# Patient Record
Sex: Female | Born: 1975 | Race: White | Hispanic: No | Marital: Married
Health system: Southern US, Community
[De-identification: ages and names within clinical notes are randomized; demographics above are authoritative.]

## PROBLEM LIST (undated history)

## (undated) HISTORY — PX: BREAST BIOPSY: SHX20

---

## 2001-08-21 ENCOUNTER — Inpatient Hospital Stay (HOSPITAL_COMMUNITY): Admission: AD | Admit: 2001-08-21 | Discharge: 2001-08-24 | Payer: Self-pay | Admitting: Obstetrics and Gynecology

## 2001-08-24 ENCOUNTER — Inpatient Hospital Stay (HOSPITAL_COMMUNITY): Admission: AD | Admit: 2001-08-24 | Discharge: 2001-08-24 | Payer: Self-pay | Admitting: Obstetrics & Gynecology

## 2001-09-17 ENCOUNTER — Other Ambulatory Visit: Admission: RE | Admit: 2001-09-17 | Discharge: 2001-09-17 | Payer: Self-pay | Admitting: Obstetrics and Gynecology

## 2002-10-08 ENCOUNTER — Other Ambulatory Visit: Admission: RE | Admit: 2002-10-08 | Discharge: 2002-10-08 | Payer: Self-pay | Admitting: Obstetrics and Gynecology

## 2004-11-24 ENCOUNTER — Inpatient Hospital Stay (HOSPITAL_COMMUNITY): Admission: RE | Admit: 2004-11-24 | Discharge: 2004-11-27 | Payer: Self-pay | Admitting: Obstetrics and Gynecology

## 2008-06-28 ENCOUNTER — Encounter: Admission: RE | Admit: 2008-06-28 | Discharge: 2008-06-28 | Payer: Self-pay | Admitting: Endocrinology

## 2008-06-30 ENCOUNTER — Encounter: Admission: RE | Admit: 2008-06-30 | Discharge: 2008-06-30 | Payer: Self-pay | Admitting: Endocrinology

## 2009-10-03 ENCOUNTER — Encounter: Admission: RE | Admit: 2009-10-03 | Discharge: 2009-10-03 | Payer: Self-pay | Admitting: Endocrinology

## 2010-09-12 IMAGING — US US SOFT TISSUE HEAD/NECK
1 series · 14 of 25 positions shown · non-contrast
Comparison: None.

June 30, 2008 –DUPLICATE COPY for exam association in [facility]. No change from original report.
CLINICAL DATA: Goiter by physical exam.

 THYROID ULTRASOUND
TECHNIQUE: Ultrasound examination of the thyroid gland and
 adjacent soft tissues was performed.

[Series 1: us soft tissue head/neck · 0.08mm/px · 14 of 38 slices shown]
[im 1/38]
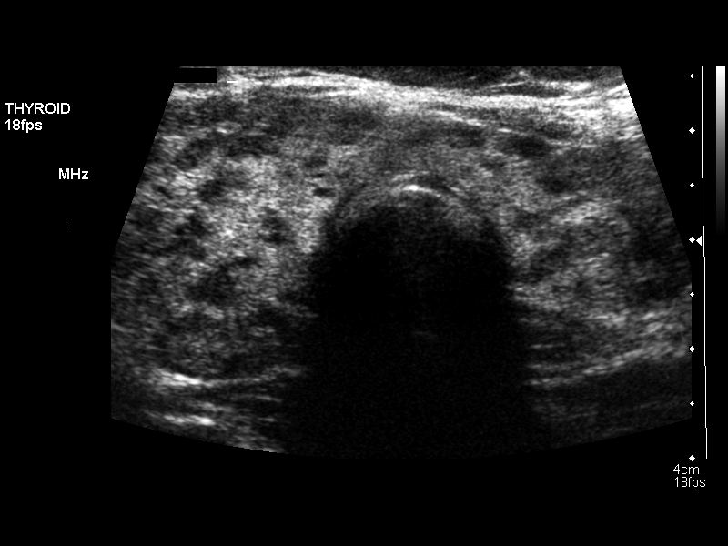
[im 4/38]
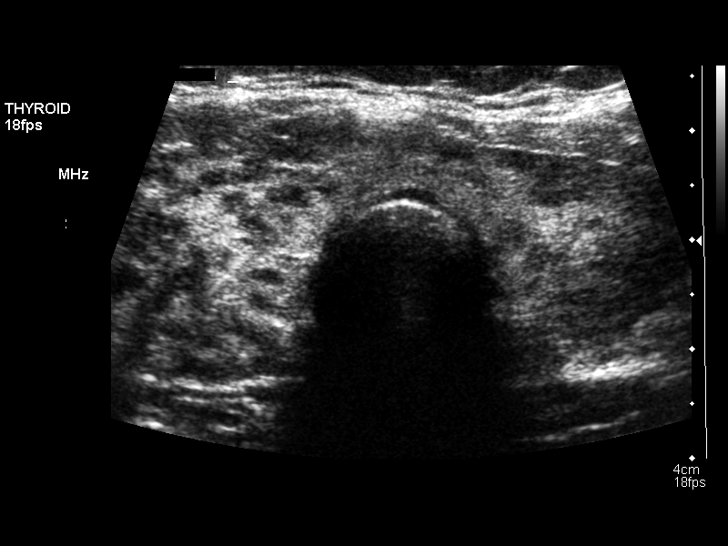
[im 7/38]
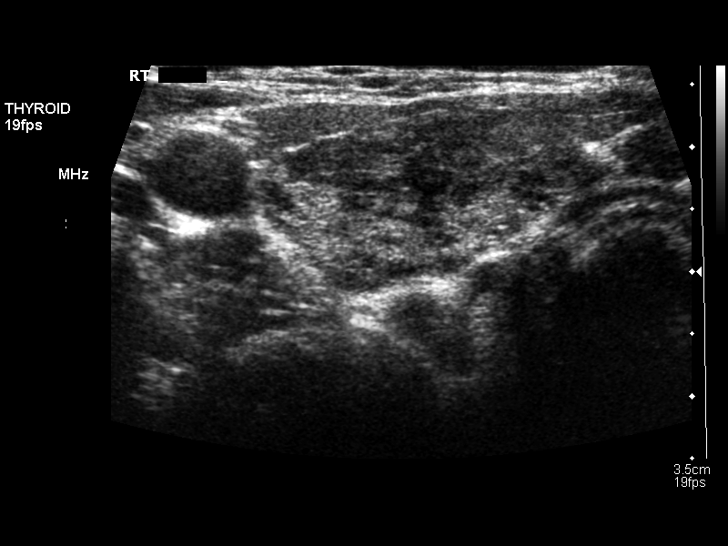
[im 10/38]
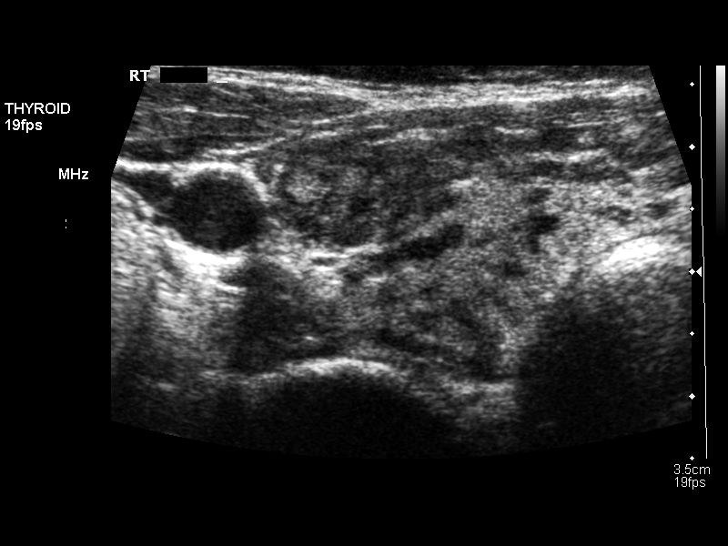
[im 13/38]
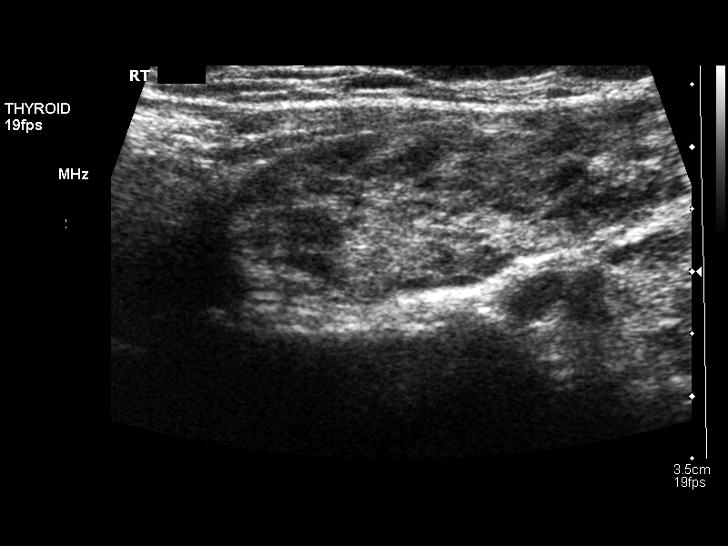
[im 14/38]
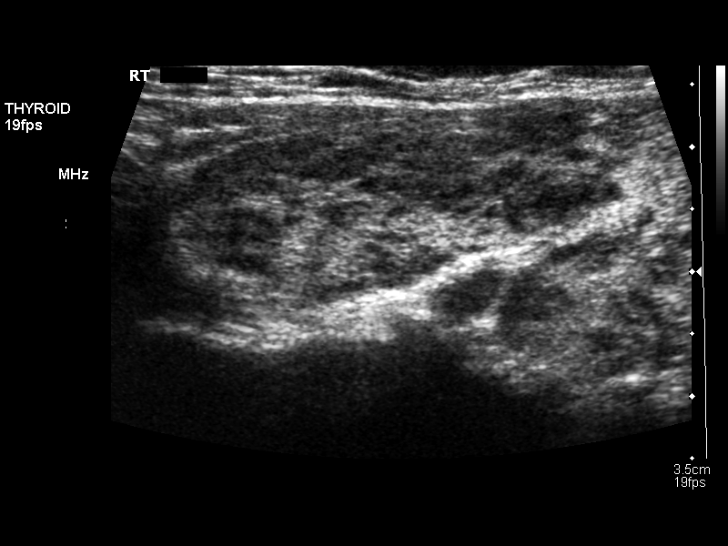
[im 17/38]
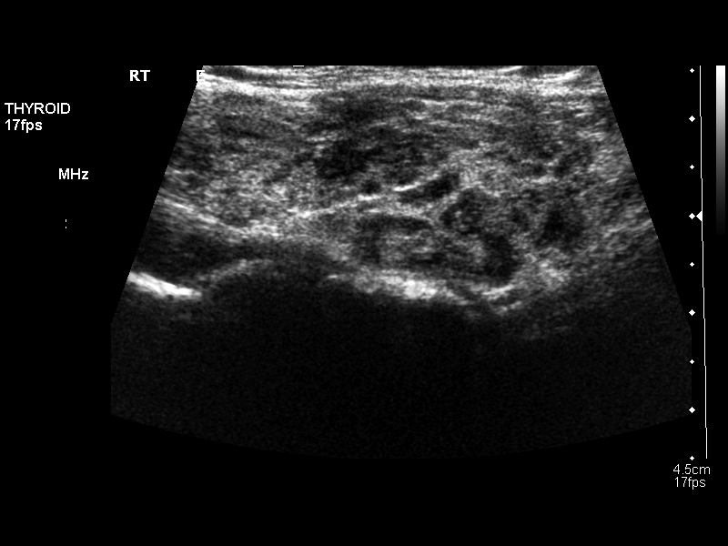
[im 21/38]
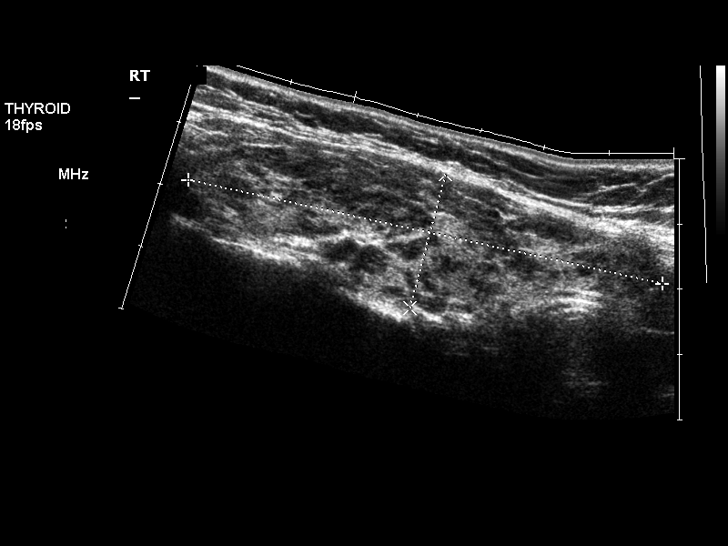
[im 24/38]
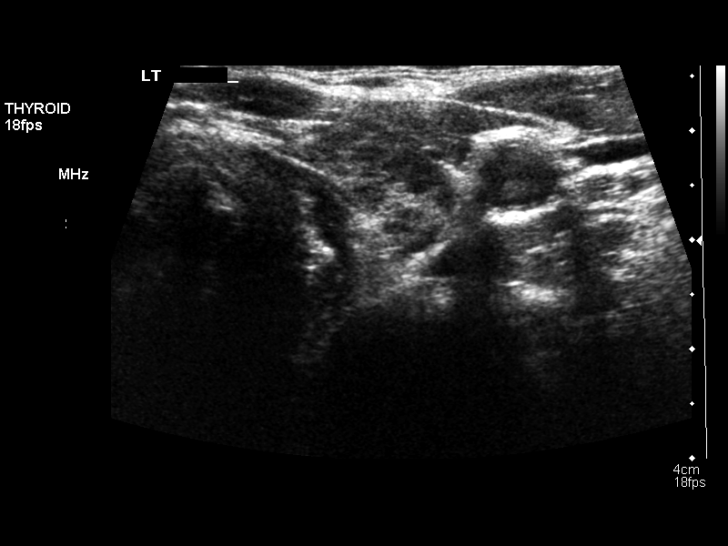
[im 25/38]
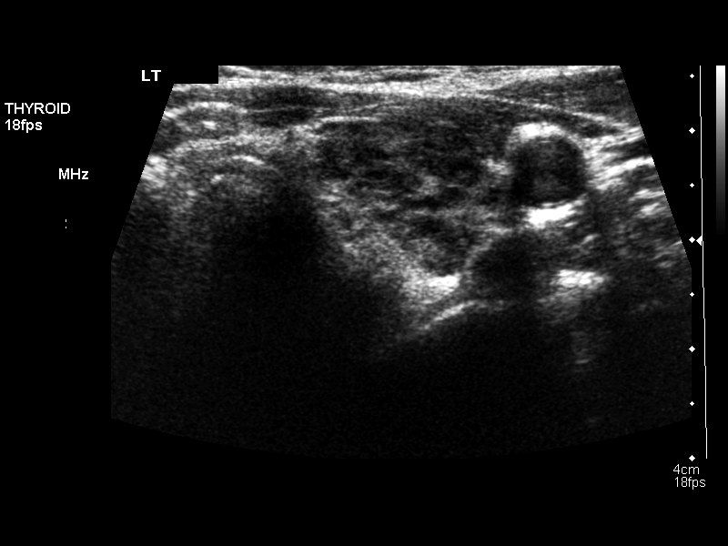
[im 28/38]
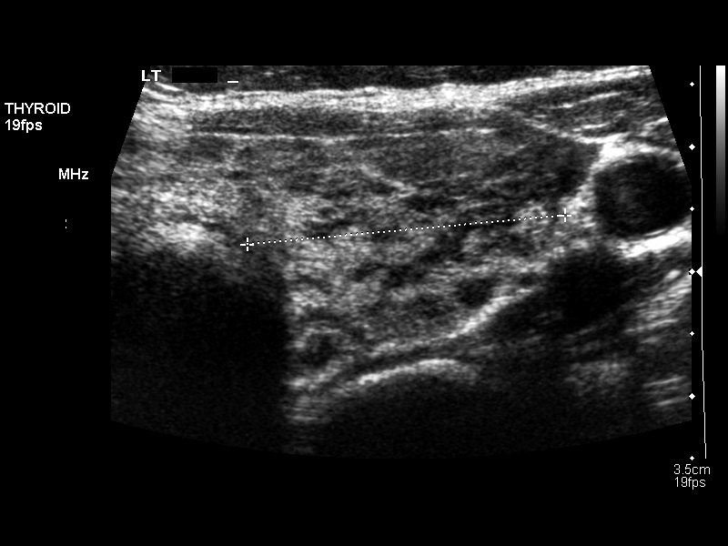
[im 31/38]
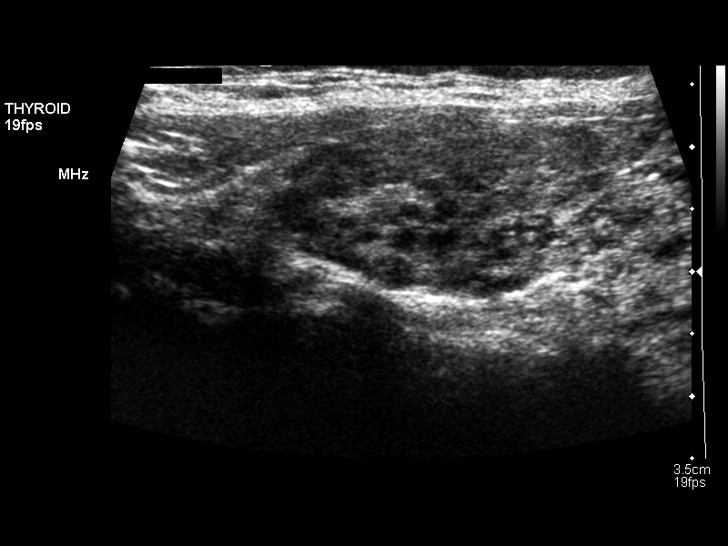
[im 34/38]
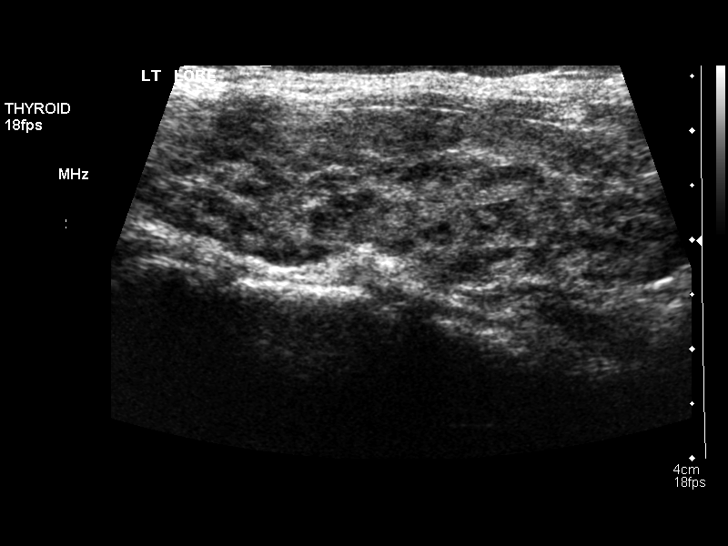
[im 38/38]
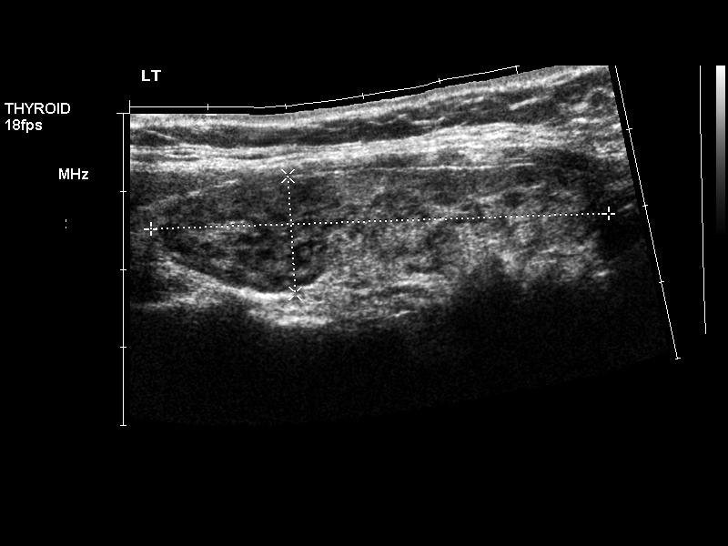

[14 of 25 positions shown; findings below may reference images not displayed]

FINDINGS: Heterogeneously enlarged thyroid gland. Right lobe
 measures 7.4 x 2.1 x 2.5 cm. Left lobe measures 6.0 x 1.5 x
 cm. Isthmus measures 0.93 cm. Initially, questionable lesions
 within the mid aspect of the right lobe of the thyroid gland. The
 patient returned for additional imaging and no dominant masses
 noted. Stability of this appearance can be confirmed on follow-
 up.
IMPRESSION: Thyroid goiter with heterogeneous appearance. No
 dominant solid masses identified.

## 2010-09-15 NOTE — H&P (Signed)
[facility]  Patient:    Gina Graham, Gina Graham Visit Number: [id number] MRN: [id number]          Service Type: OBS Location: 910B 9198 03 Attending Physician:  Lenoard Aden Dictated by:   Lenoard Aden, M.D. Admit Date:  08/21/2001                           History and Physical  CHIEF COMPLAINT:              Macrosomia.  HISTORY OF PRESENT ILLNESS:   The patient is a 35 year old Caucasian female, G1, P0, EDD Aug 28, 2001 of 39 weeks with ultrasound estimated fetal weight of greater than 4500 gm with polyhydramnios for elective primary cesarean section.  PAST MEDICAL HISTORY:         Remarkable for questionable hypothyroidism with normal TSH this pregnancy. History of migraine headaches, history of cholecystectomy in February 2002. Family history of cervical cancer. No other medical or surgical hospitalizations.  Note, pregnancy course complicated only by polyhydramnios with macrosomia and reassuring fetal surveillance.  ALLERGIES:                    PENICILLIN.  PRENATAL LABORATORY DATA:     Blood type A-, Rh antibody negative, rubella nonimmune, hepatitis B surface antigen negative, HIV nonreactive. GC, chlamydia negative.  PHYSICAL EXAMINATION:  GENERAL:                      She is a well-developed, well-nourished, white female in no apparent distress.  HEENT:                        Normal.  LUNGS:                        Clear.  HEART:                        Regular rhythm.  ABDOMEN:                      Soft, gravid, nontender. Estimated fetal weight 10 to 10 1/2 pounds. Cervix is closed. ______ vertex and floating.  EXTREMITIES:                  Reveal no ______.  NEUROLOGIC:                   Nonfocal.  IMPRESSION:                    1. A 39 week intrauterine pregnancy.                                2. Rubella not immune.                                3. Rh negative.                                4. Presumed  macrosomia.  PLAN:                         Proceed with  primary cesarean section for macrosomia. Risks of inaccuracy of estimated fetal weight on ultrasound discussed 10-15% discrepancy with estimated fetal weight potentially ranging between 8 1/2 to 9 pounds to as large as 11 pounds discussed. Risk of shoulder distortion, fourth degree laceration and traumatic vaginal delivery discussed as well. After the patients questions are answered, she has made the elective decision in conjunction with her husband despite inaccuracies of estimated fetal weight to proceed with primary cesarean section. The risks of anesthesia, infection, bleeding, injury to abdominal organs and need for repair is discussed. Delayed versus immediate complications to include bowel and bladder injury noted. The patient desires to proceed. Dictated by:   Lenoard Aden, M.D. Attending Physician:  Lenoard Aden DD:  08/21/01 TD:  08/21/01 Job: [id number]

## 2010-09-15 NOTE — H&P (Signed)
Gina Graham, Gina Graham              ACCOUNT NO.:  [id number]   MEDICAL RECORD NO.:  [id number]          PATIENT TYPE:  INP   LOCATION:  NA                            FACILITY:  [facility]   PHYSICIAN:  Lenoard Aden, M.D.DATE OF BIRTH:  1975-08-20   DATE OF ADMISSION:  11/24/2004  DATE OF DISCHARGE:                                HISTORY & PHYSICAL   CHIEF COMPLAINT:  Severe polyhydramnios at 38 weeks for elective repeat  cesarean section with presumed macrosomia.   HISTORY OF PRESENT ILLNESS:  The patient is a 35 year old white female,  gravida 2, para 1, EDD of December 13, 2004, with progressively worsening  polyhydramnios and an estimated fetal weight of greater than 4500 grams who  presents for induction.   PAST MEDICAL HISTORY:  Remarkable for questionable hypothyroidism with  normal TSH, history of previous cesarean section, history of migraine  headaches, history of cholecystectomy.   FAMILY HISTORY:  Remarkable for cervical cancer.   ALLERGIES:  PENICILLIN.   MEDICATIONS:  Prenatal vitamins.   PRENATAL LABORATORY DATA:  Blood type A negative, Rh antibody negative,  rubella immune, hepatitis B surface antigen negative, HIV nonreactive.  GC  and Chlamydia negative.   PHYSICAL EXAMINATION:  GENERAL:  She is a well-developed, well-nourished,  white female in no acute distress.  HEENT:  Normal.  LUNGS:  Clear.  HEART:  Regular rate and rhythm.  ABDOMEN:  Soft, gravid, and nontender.  Fundal height of 46.  PELVIC:  Cervix is closed, long, vertex -2.   IMPRESSION:  1.  38-week intrauterine pregnancy.  2.  Severe worsening polyhydramnios with presumed macrosomia.   PLAN:  Proceed with elective repeat cesarean section.  Risks and benefits  discussed.  Small risk of prematurity noted.  The patient acknowledges and  wishes to proceed.  Risks of anesthesia, infection, bleeding, injury to  abdominal organs with need for repair noted.       RJT/MEDQ  D:  11/23/2004  T:   11/23/2004  Job:  [id number]

## 2010-09-15 NOTE — Discharge Summary (Signed)
Gina Graham, Gina Graham              ACCOUNT NO.:  [id number]   MEDICAL RECORD NO.:  [id number]          PATIENT TYPE:  INP   LOCATION:  9108                          FACILITY:  [facility]   PHYSICIAN:  Lenoard Aden, M.D.DATE OF BIRTH:  29-Jun-1975   DATE OF ADMISSION:  11/24/2004  DATE OF DISCHARGE:  11/27/2004                                 DISCHARGE SUMMARY   Patient underwent uncomplicated repeat cesarean section on November 24, 2004.  Postoperative course uncomplicated.  Hemoglobin 10.2.  Pain control  adequate.  Discharged to home postoperative day #3.  Tolerated regular diet  well.  Discharge teaching done.  Discharge medications to include Tylox,  prenatal vitamins.  Follow up in the office in four to six weeks.      Lenoard Aden, M.D.  Electronically Signed     RJT/MEDQ  D:  01/14/2005  T:  01/15/2005  Job:  [id number]

## 2010-09-15 NOTE — Discharge Summary (Signed)
[facility]  Patient:    Gina Graham, Gina Graham Visit Number: [id number] MRN: [id number]          Service Type: MED Location: [facility] Attending Physician:  Genia Del Dictated by:   Lenoard Aden, M.D. Admit Date:  08/24/2001 Discharge Date: 08/24/2001                             Discharge Summary  ADMISSION DIAGNOSES:          Presumed macrosomia at 39 weeks.  DISCHARGE DIAGNOSES:          Presumed macrosomia at 39 weeks.  HOSPITAL COURSE:              Patient underwent uncomplicated primary cesarean section August 20, 2001 without complications.  Postpartum course uncomplicated.  Tolerated regular diet without difficulty.  Hemoglobin 10.9, hematocrit 31.5.  Discharged to home day #3.  Tylox given.  Discharge teaching done.  Follow up in the office in four to six weeks. Dictated by:   Lenoard Aden, M.D. Attending Physician:  Genia Del DD:  09/14/01 TD:  09/16/01 Job: [id number]

## 2010-09-15 NOTE — Discharge Summary (Signed)
Gina Graham, LACROSS              ACCOUNT NO.:  [id number]   MEDICAL RECORD NO.:  [id number]          PATIENT TYPE:  INP   LOCATION:  9108                          [facility]ILITY:  [facility]   PHYSICIAN:  Lenoard Aden, M.D.DATE OF BIRTH:  10/03/1975   DATE OF ADMISSION:  11/24/2004  DATE OF DISCHARGE:  11/27/2004                                 DISCHARGE SUMMARY   The patient underwent a complicated repeat cesarean section on November 25, 2004.  Postoperative course uncomplicated.  Discharged to home day #3.  Tylox, prenatal vitamins and iron given.  Discharge teaching done.   DISCHARGE DIAGNOSIS:  Repeat cesarean section.   FOLLOW UP:  Follow up in the office in four to six weeks.      Lenoard Aden, M.D.  Electronically Signed     RJT/MEDQ  D:  01/22/2005  T:  01/23/2005  Job:  [id number]

## 2010-09-15 NOTE — Op Note (Signed)
[facility]  Patient:    Gina Graham, Gina Graham Visit Number: [id number] MRN: [id number]          Service Type: OBS Location: 910A 9113 01 Attending Physician:  Lenoard Aden Dictated by:   Lenoard Aden, M.D. Proc. Date: 08/21/01 Admit Date:  08/21/2001                             Operative Report  PREOPERATIVE DIAGNOSES:       A 39 week intrauterine pregnancy, presumed macrosomia.  POSTOPERATIVE DIAGNOSES:      A 39 week intrauterine pregnancy, presumed macrosomia.  PROCEDURE:                    Primary low transverse cesarean section.  SURGEON:                      Lenoard Aden, M.D.  ASSISTANTBasilia Jumbo  ANESTHESIA:                   Spinal by [place].  ESTIMATED BLOOD LOSS:         1000 cc.  DRAINS:                       Foley.  COUNTS:                       Correct.  DISPOSITION:                  Patient to recovery in good condition.  FINDINGS:                     Full-term living female occiput anterior position. Placenta manually and intact.  Three vessel cord.  An 8 pound 15 ounce female. Pediatricians in attendance.  Apgars 8 and 9.  Normal tubes.  Normal ovaries.  BRIEF OPERATIVE NOTE:         After achieving adequate spinal anesthesia patient is prepped and draped in the usual sterile fashion.  Foley catheter placed.  After achieving adequate anesthesia dilute Marcaine solution placed in the area.  Pfannenstiel skin incision was then made with the scalpel, carried down to the fascia which was nicked in the midline and opened transversely using Mayo scissors.  Rectus muscles dissected sharply in the midline.  Peritoneum entered sharply.  Visceroperitoneum scored in a smile like fashion after bladder blade is placed.  The scoring of the lower uterine segment with a Kerr hysterotomy incision was performed.  Clear amniotic fluid. Atraumatic delivery from vertex position of a full-term living female.   Handed to pediatricians in attendance.  Apgars 8 and 9.  Placenta delivered manually intact.  Cord blood collected.  Uterus curetted using a dry lap pack. Inspection reveals normal tubes and ovaries.  Uterine incision closed in two layers using a 0 Monocryl suture.  Bladder flap inspected and found to be hemostatic.  Pericolic gutters irrigated.  All blood clots subsequently removed.  Fascia then closed using a 0 Vicryl in a continuous running fashion. Skin closed using staples.  Patient tolerates procedure well.  Is transferred to recovery in good condition. Dictated by:   Lenoard Aden, M.D. Attending Physician:  Lenoard Aden DD:  08/21/01 TD:  08/21/01 Job:  [id number]

## 2010-09-15 NOTE — Op Note (Signed)
NAMEJANELIZ, Gina Graham              ACCOUNT NO.:  [id number]   MEDICAL RECORD NO.:  [id number]          PATIENT TYPE:  INP   LOCATION:  9199                          FACILITY:  [facility]   PHYSICIAN:  Lenoard Aden, M.D.DATE OF BIRTH:  1975/07/19   DATE OF PROCEDURE:  11/24/2004  DATE OF DISCHARGE:                                 OPERATIVE REPORT   PREOPERATIVE DIAGNOSIS:  Severe polyhydramnios at 38 weeks.  Previous  cesarean section.   POSTOPERATIVE DIAGNOSIS:  Severe polyhydramnios at 38 weeks.  Previous  cesarean section.  Plus breech presentation.   PROCEDURE:  Repeat low transverse cesarean section.   SURGEON:  Lenoard Aden, M.D.   ASSISTANT:  Pershing Cox, M.D.   ANESTHESIA:  Spinal by Octaviano Glow. [name], M.D.   ESTIMATED BLOOD LOSS:  1000 mL.   COMPLICATIONS:  None.   DRAINS:  None.   FINDINGS:  Full term living female in frank breech presentation.  Apgars 8  and 9.  Cord blood collected.  Placenta delivered from posterior location  intact.  The patient to recovery room in good condition.   DESCRIPTION OF PROCEDURE:  After being apprised of the risks of anesthesia,  infection, bleeding, injury to abdominal organs with need for repair,  delayed versus immediate complications to include bowel and bladder injury,  the patient was brought to the operating room where she was administered a  spinal anesthetic without complications.  Foley catheter placed.  After  achieving adequate anesthesia, dilute Marcaine solution placed.  A  Pfannenstiel skin incision was made with a scalpel and carried down to the  fascia which was nicked in the midline and opened transversely using Mayo  scissors.  Rectus muscles dissected sharply in the midline.  Peritoneum  entered sharply.  Bladder blade placed.  Visceroperitoneum was scored in a  smile-like fashion, dissected sharply off the lower uterine segment.  Kerr  hysterotomy incision made.  Copious amounts of clear amniotic  fluid seen.  Frank breech presentation encountered and delivered using usual maneuvers  atraumatically in standard fashion.  Bulb suction performed.  Cord clamped  and cut.  Bulb suctioning performed.  Placenta delivered manually intact.  Three-vessel cord noted.  Uterus exteriorized and curetted using a dry  laparoscopy pack.  Closed in two layers using 0 Monocryl suture.  Bladder  flap inspected and found to be hemostatic.  Three interrupted sutures placed  along bleeding points  along the incision without difficulty.  Good hemostasis noted.  Irrigation  accomplished.  Fascia then closed using a 0 Monocryl in continuous running  fashion.  The subcutaneous tissue closed using a 3-0 plain suture.  Skin  used using staples.  The patient tolerated the procedure well and is  transferred to recovery room in good condition.       RJT/MEDQ  D:  11/24/2004  T:  11/24/2004  Job:  [id number]

## 2011-06-22 ENCOUNTER — Other Ambulatory Visit: Payer: Self-pay | Admitting: Endocrinology

## 2011-06-22 DIAGNOSIS — E049 Nontoxic goiter, unspecified: Secondary | ICD-10-CM

## 2011-06-26 ENCOUNTER — Ambulatory Visit
Admission: RE | Admit: 2011-06-26 | Discharge: 2011-06-26 | Disposition: A | Discharge status: Home / Self Care | Payer: 59 | Source: Ambulatory Visit | Attending: Endocrinology | Admitting: Endocrinology

## 2011-06-26 DIAGNOSIS — E049 Nontoxic goiter, unspecified: Secondary | ICD-10-CM

## 2011-12-16 IMAGING — US US SOFT TISSUE HEAD/NECK
1 series · 14 of 25 positions shown · non-contrast
Comparison: Thyroid ultrasound 06/30/2008.

CLINICAL DATA: Follow up goiter. Patient on Levothyroxine.  History
of Hashimoto's disease.

THYROID ULTRASOUND 10/03/2009:
TECHNIQUE: Ultrasound examination of the thyroid gland and
adjacent soft tissues was performed.

[Series 1: us soft tissue head/neck · 0.08mm/px · 14 of 25 slices shown]
[im 1/25]
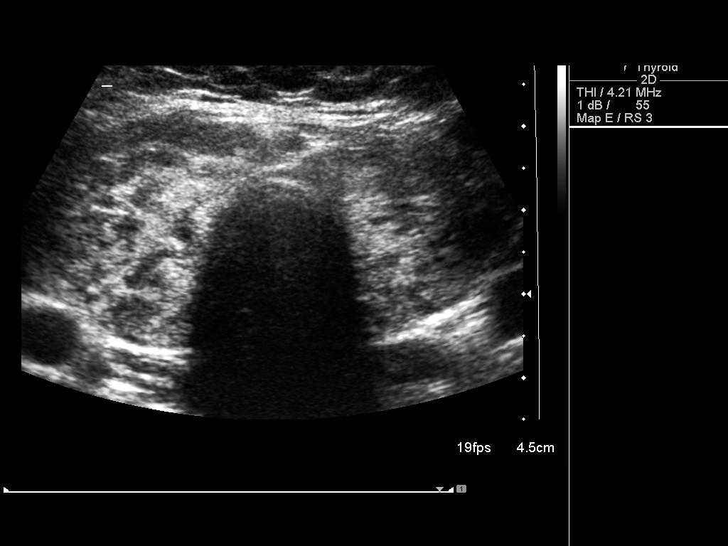
[im 3/25]
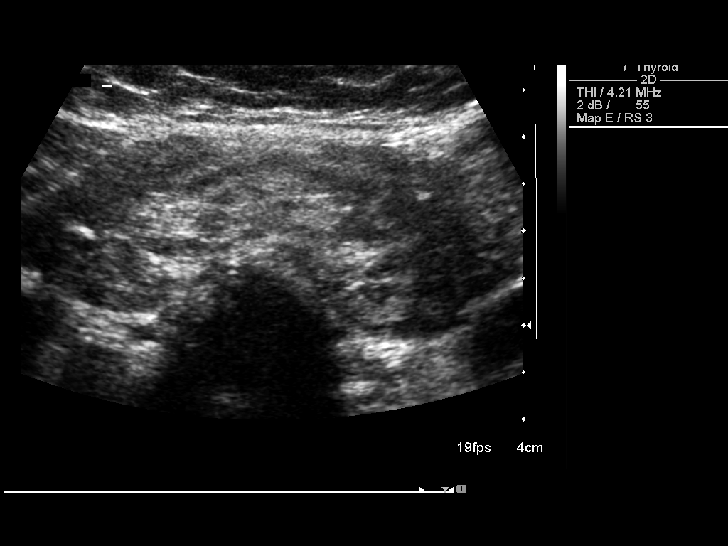
[im 5/25]
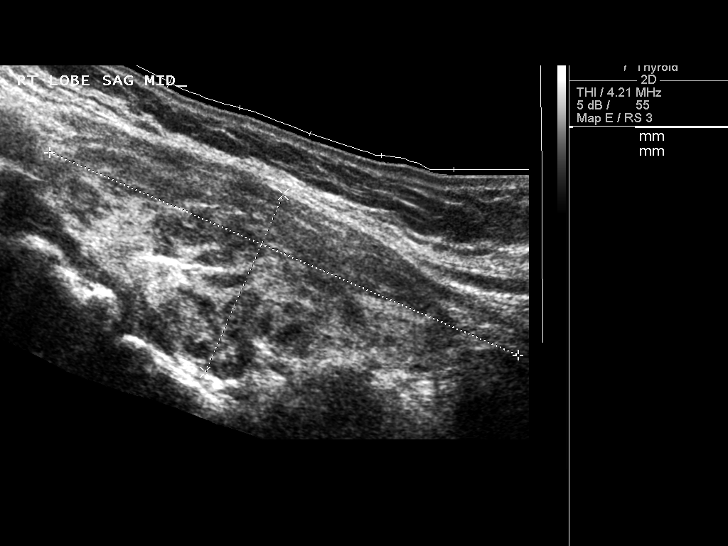
[im 7/25]
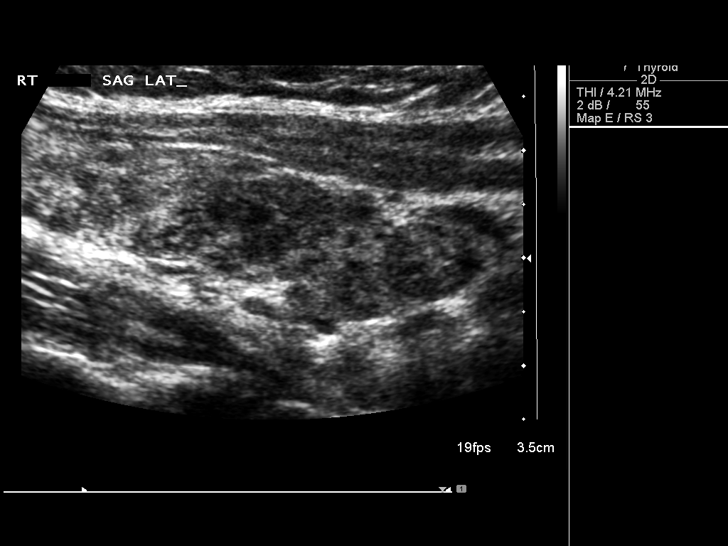
[im 9/25]
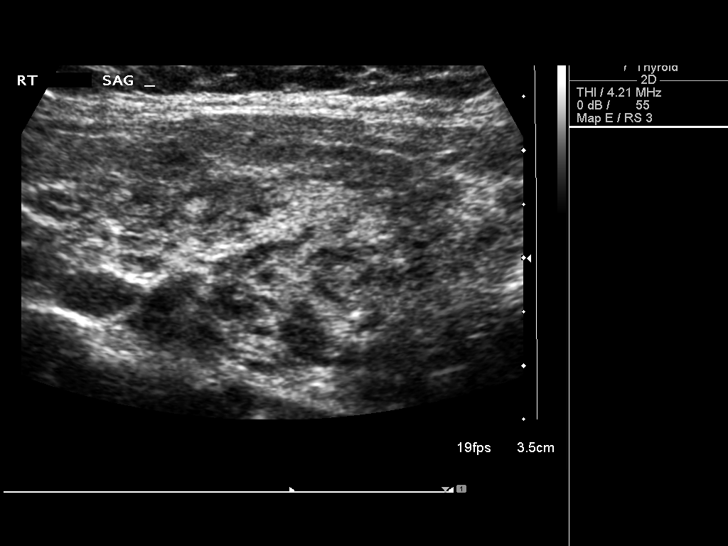
[im 10/25]
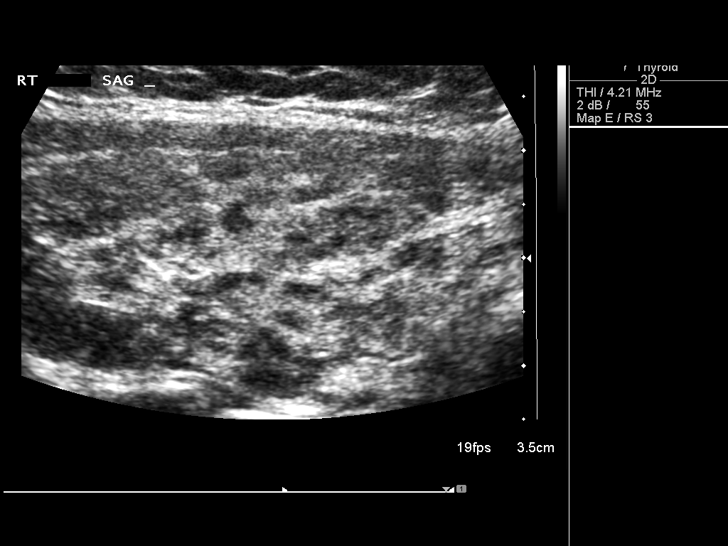
[im 12/25]
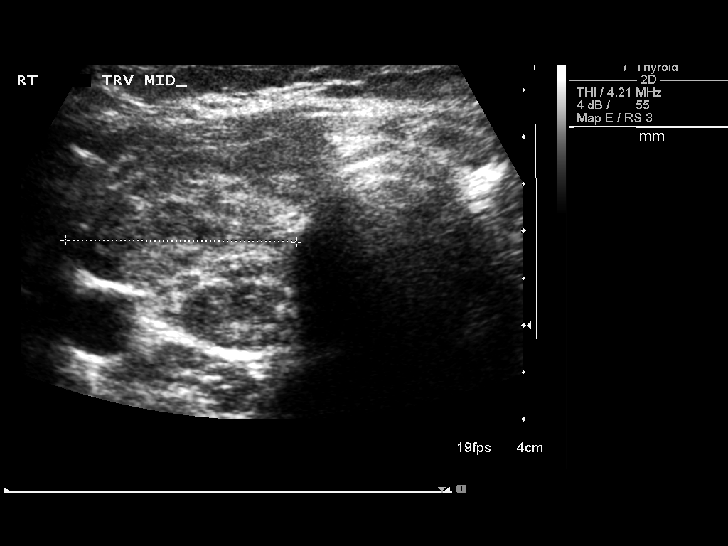
[im 14/25]
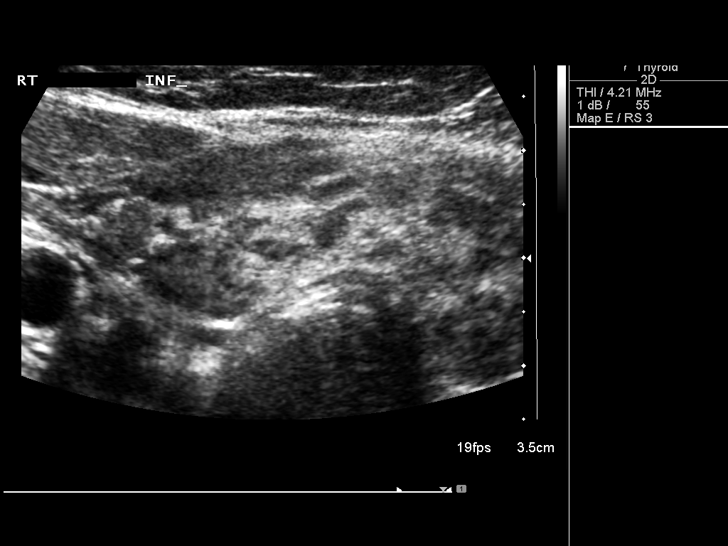
[im 16/25]
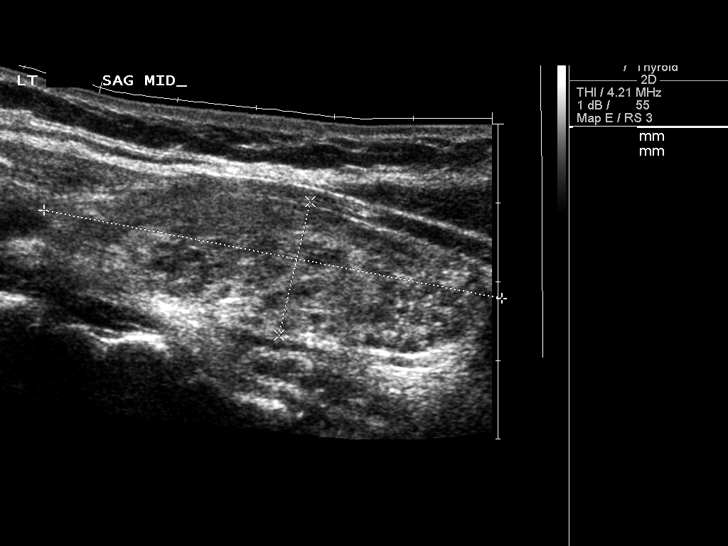
[im 17/25]
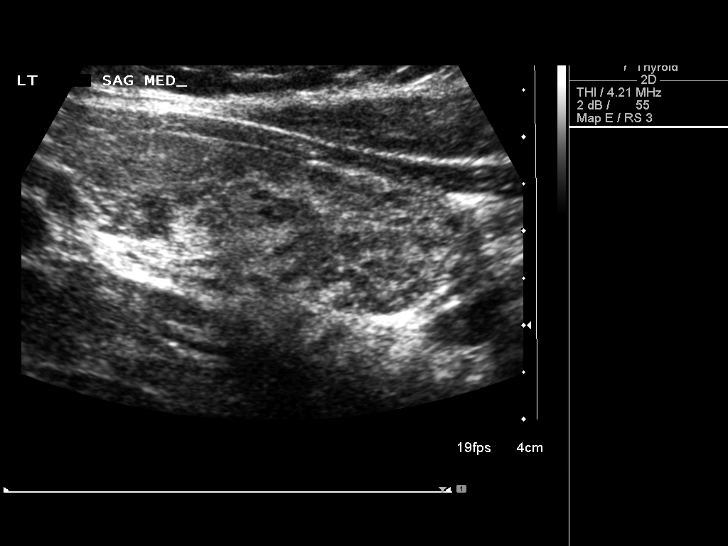
[im 19/25]
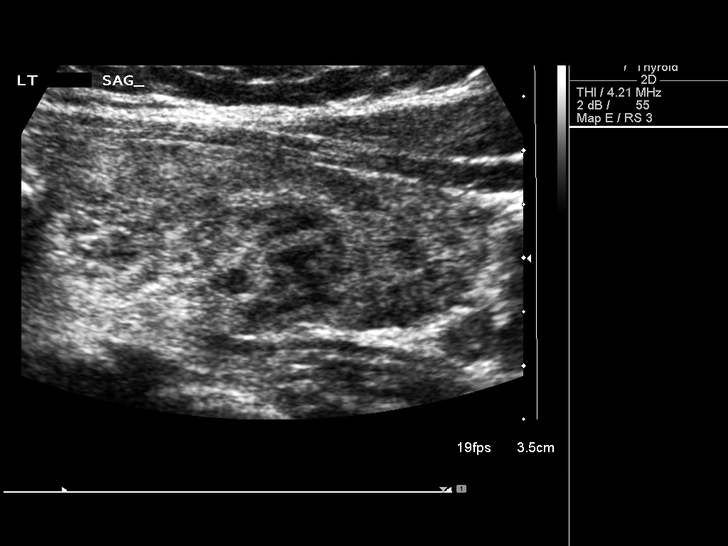
[im 21/25]
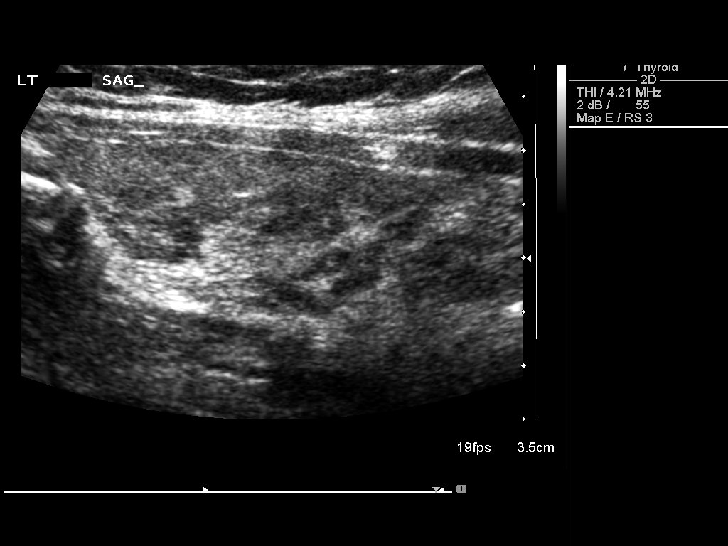
[im 23/25]
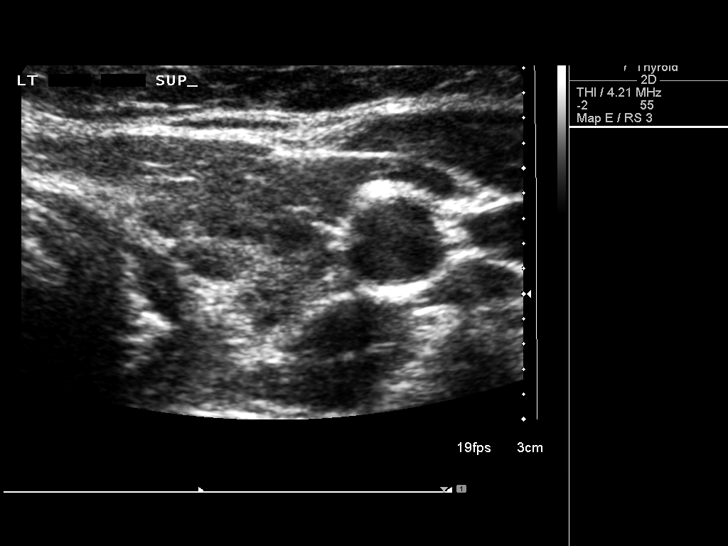
[im 25/25]
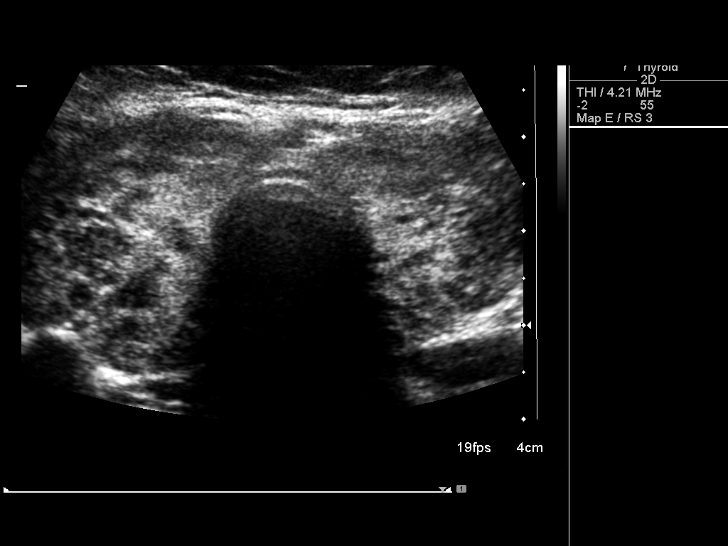

[14 of 25 positions shown; findings below may reference images not displayed]

FINDINGS: Both lobes of the thyroid gland are enlarged, not
significantly changed to slightly decreased in size since the prior
examination.  Right lobe measures approximately 6.8 x 2.6 x 2.5 cm
(previously 7.4 x 2.1 x 2.5 cm).  Left lobe measures approximately
5.9 x 1.7 x 2.2 cm (previously 6.0 x 1.5 x 2.6 cm.  Isthmus
measures approximately 0.8 cm (previously 0.9 cm).

No focal solid or cystic nodules.  Thyroid echotexture remains
heterogeneous diffusely.
IMPRESSION: 1.  Stable to slight decrease in size of the thyroid gland since
June 2008, measured above.  Gland still mildly enlarged
consistent with goiter.
2.  No solid or cystic nodules.

## 2013-01-26 ENCOUNTER — Other Ambulatory Visit: Payer: Self-pay | Admitting: Endocrinology

## 2013-01-26 DIAGNOSIS — E049 Nontoxic goiter, unspecified: Secondary | ICD-10-CM

## 2013-07-10 ENCOUNTER — Ambulatory Visit
Admission: RE | Admit: 2013-07-10 | Discharge: 2013-07-10 | Disposition: A | Discharge status: Home / Self Care | Payer: 59 | Source: Ambulatory Visit | Attending: Endocrinology | Admitting: Endocrinology

## 2013-07-10 DIAGNOSIS — E049 Nontoxic goiter, unspecified: Secondary | ICD-10-CM

## 2013-07-23 ENCOUNTER — Other Ambulatory Visit: Payer: 59

## 2013-09-07 IMAGING — US US SOFT TISSUE HEAD/NECK
1 series · 14 of 25 positions shown · non-contrast
Comparison: 10/03/2009

CLINICAL DATA: Goiter, Hashimoto's disease.

THYROID ULTRASOUND
TECHNIQUE: Ultrasound examination of the thyroid gland and adjacent
soft tissues was performed.

[Series 1: us soft tissue head/neck · 0.08mm/px · 14 of 34 slices shown]
[im 1/34]
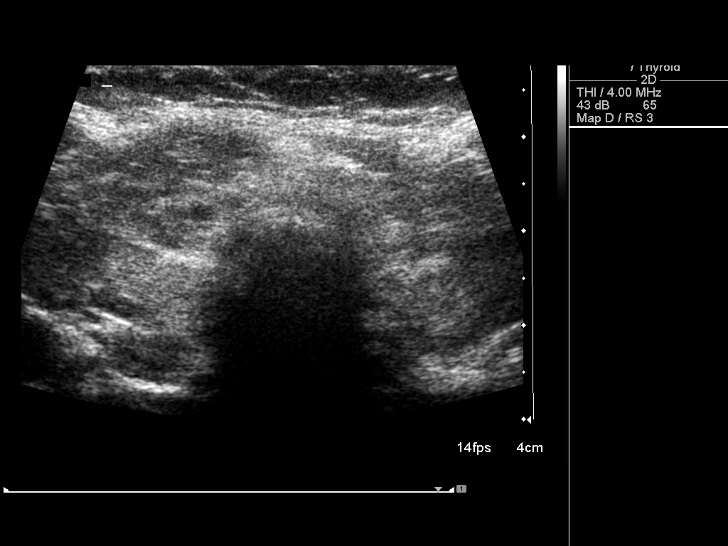
[im 3/34]
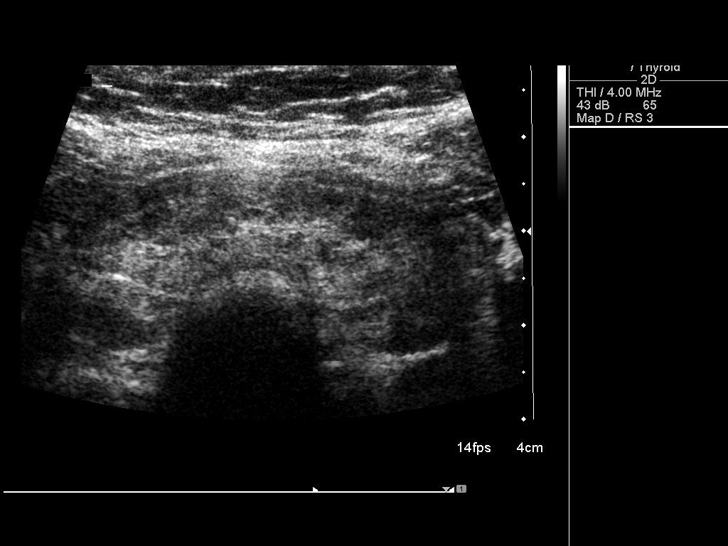
[im 6/34]
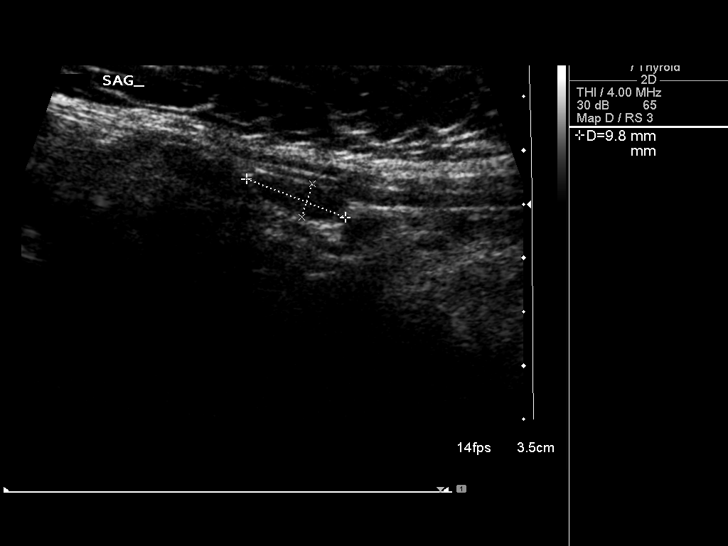
[im 9/34]
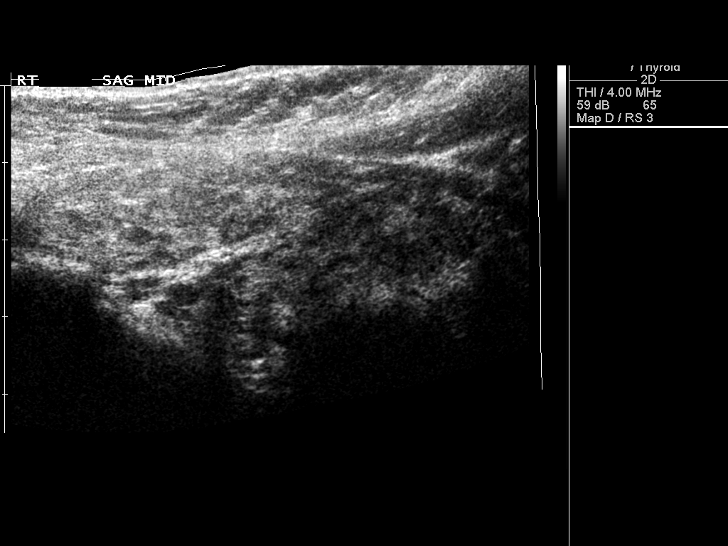
[im 12/34]
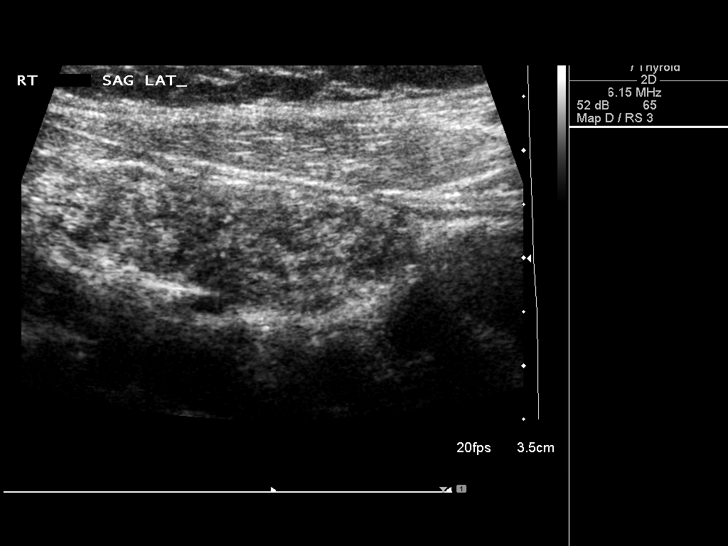
[im 13/34]
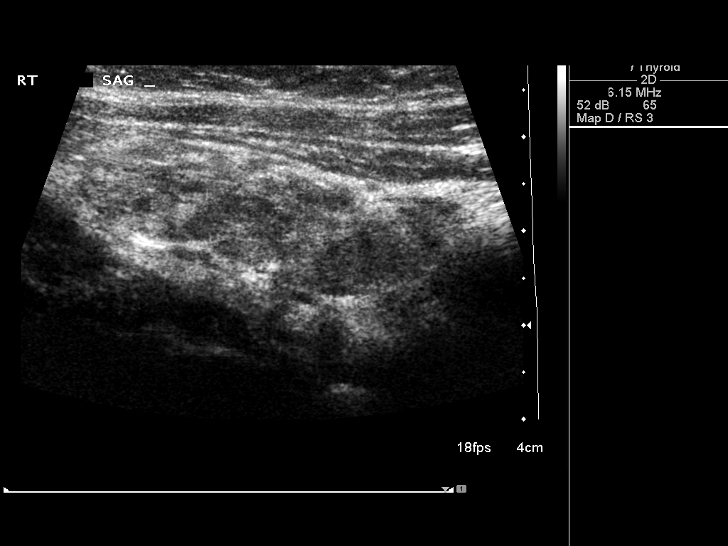
[im 16/34]
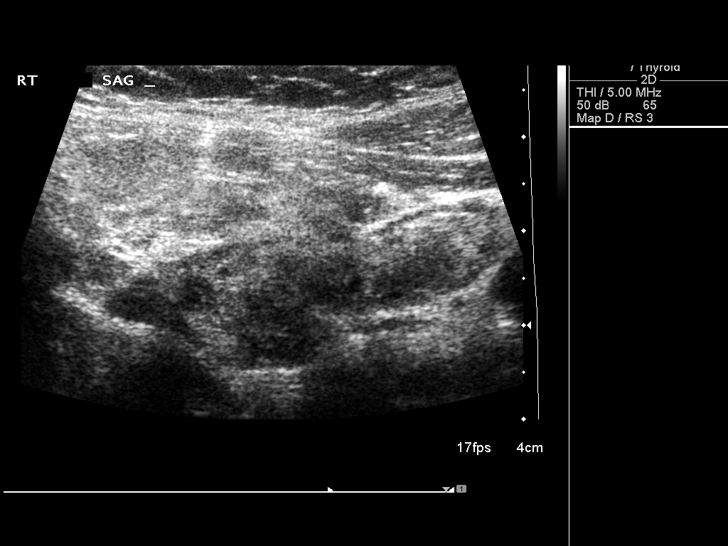
[im 18/34]
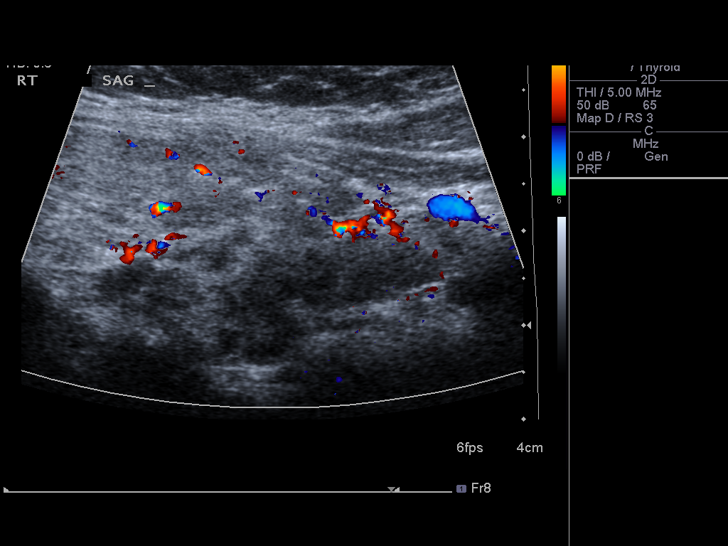
[im 21/34]
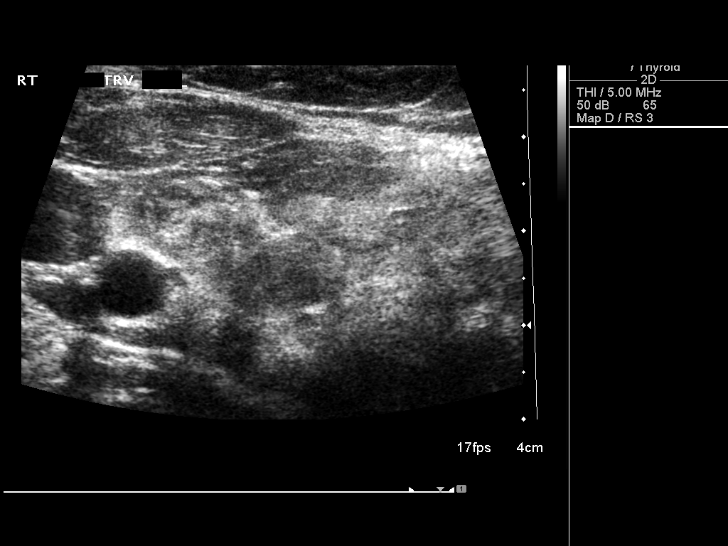
[im 23/34]
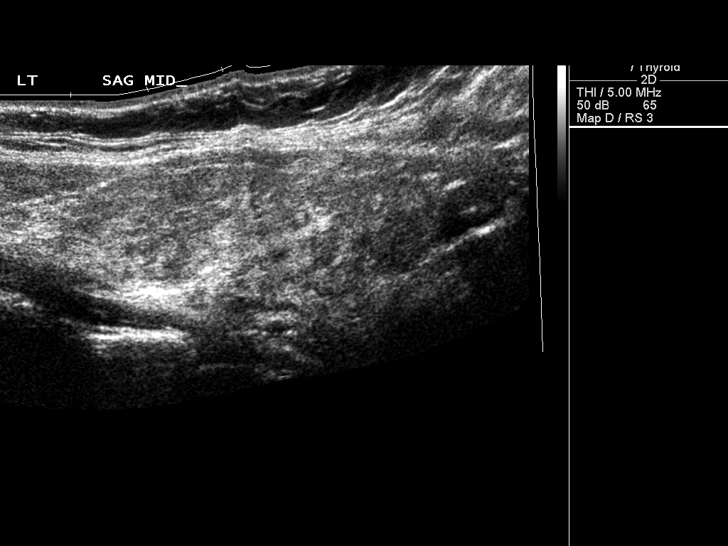
[im 25/34]
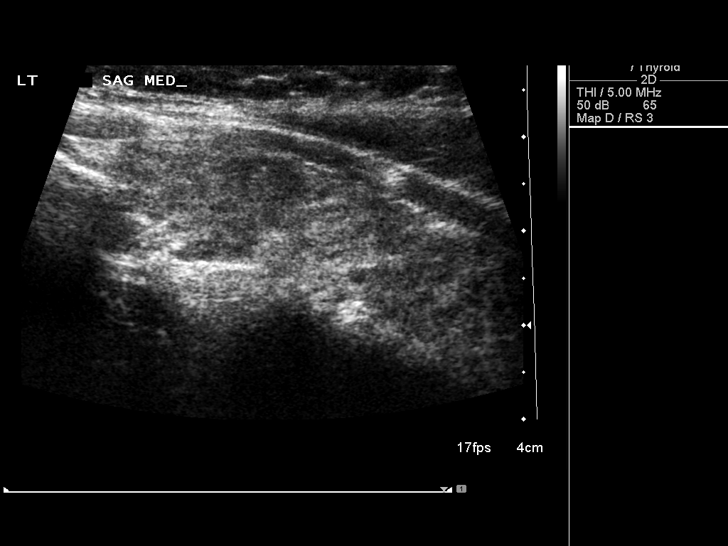
[im 28/34]
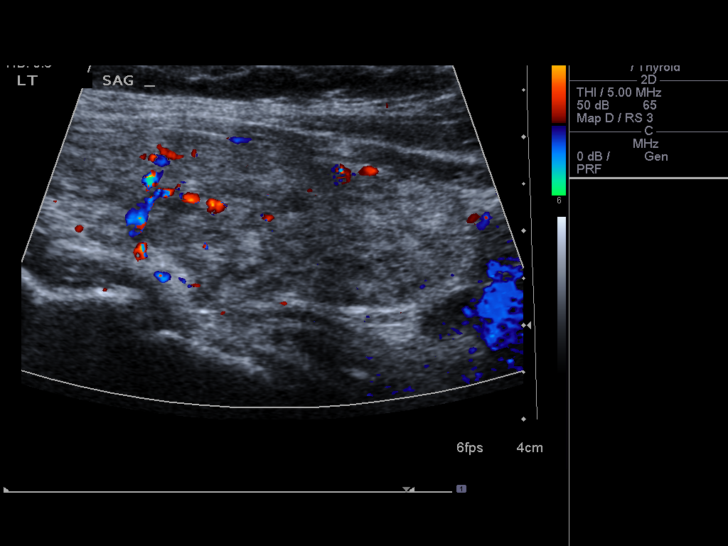
[im 31/34]
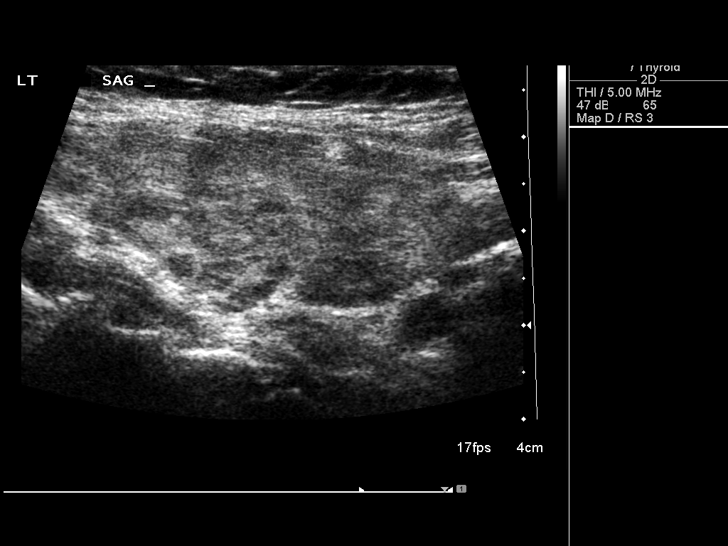
[im 34/34]
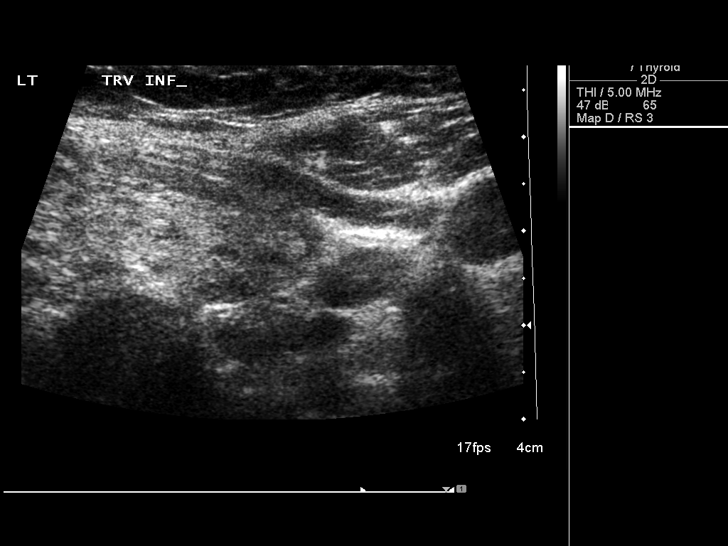

[14 of 25 positions shown; findings below may reference images not displayed]

FINDINGS: Right thyroid lobe:  27 x 27 x 65 mm, inhomogeneous (previously 25
x 26 x 68)
Left thyroid lobe:  21 x 23 x 60 mm (previously 17 x 22 x 59)
Isthmus:  9 mm in thickness, with a 3 x 7 x 10 mm cyst

Focal nodules:  None

Lymphadenopathy:  None visualized.
IMPRESSION: Little change in thyromegaly, without dominant nodule or mass.

## 2015-09-22 IMAGING — US US SOFT TISSUE HEAD/NECK
1 series · 14 of 25 positions shown · non-contrast
Comparison: 06/26/2011

CLINICAL DATA: Thyroid goiter.

EXAM:
THYROID ULTRASOUND
TECHNIQUE: Ultrasound examination of the thyroid gland and adjacent soft
tissues was performed.

[Series 1: us soft tissue head/neck · 0.09mm/px · 14 of 63 slices shown]
[im 1/63]
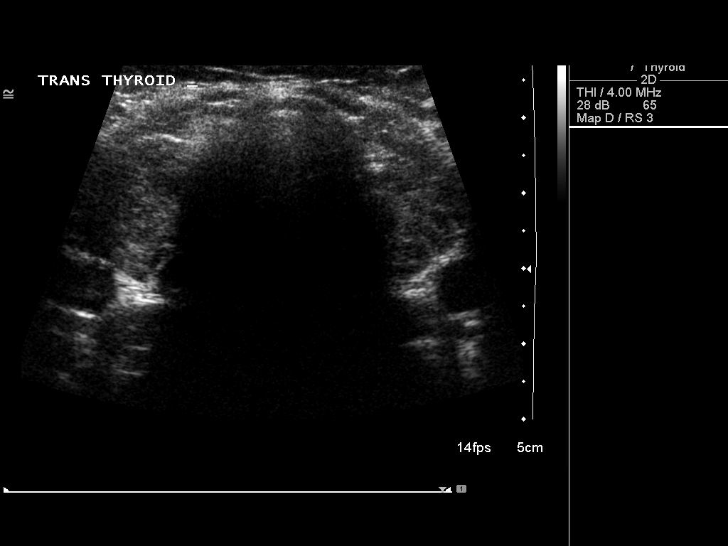
[im 6/63]
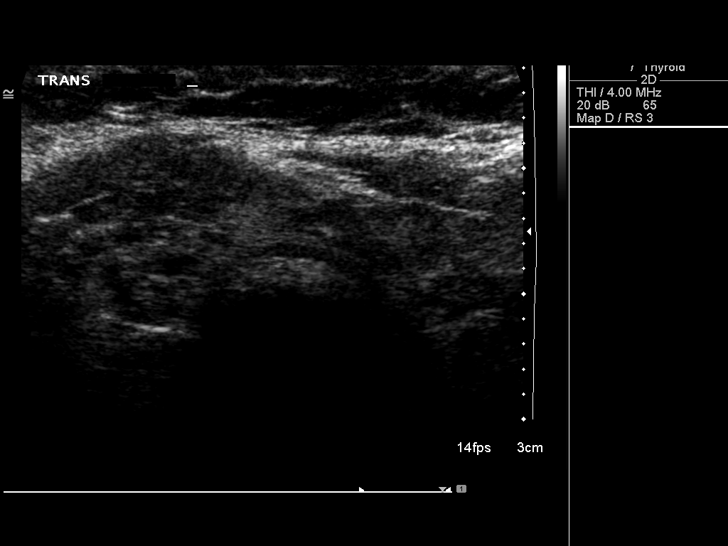
[im 11/63]
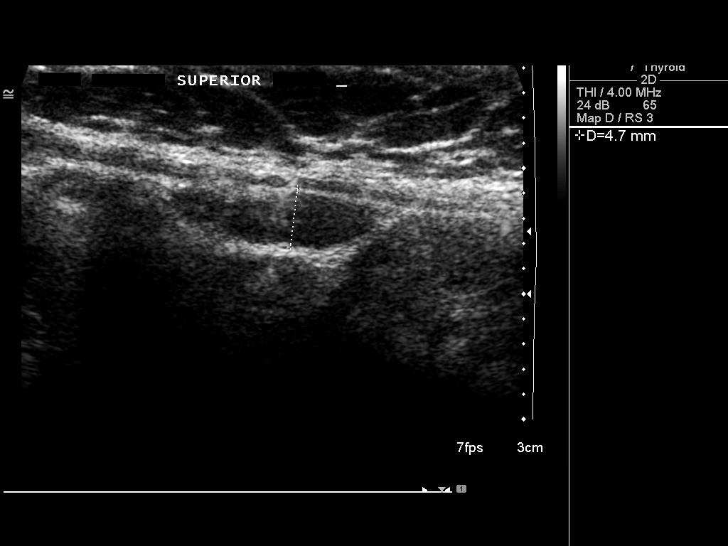
[im 16/63]
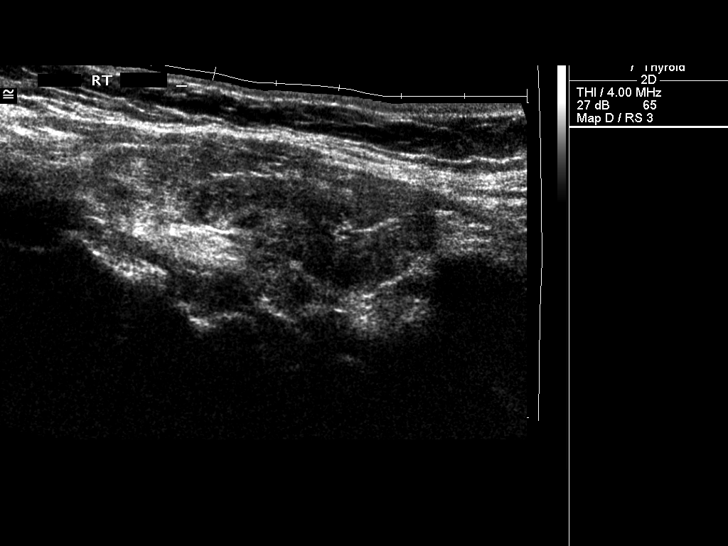
[im 21/63]
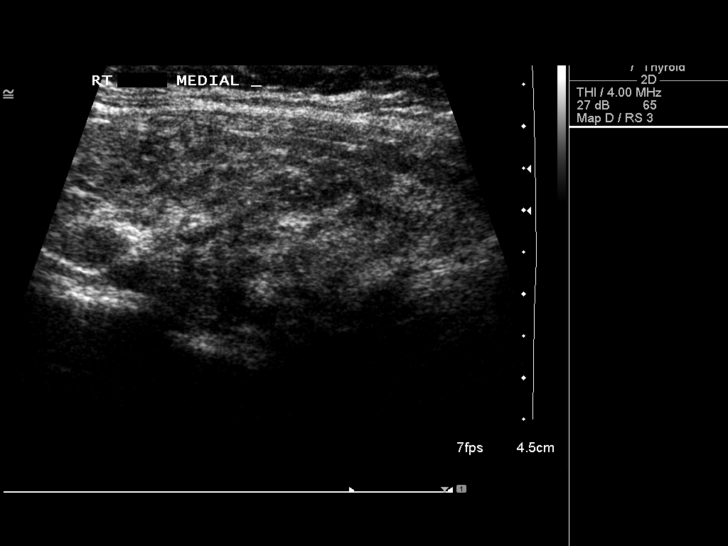
[im 24/63]
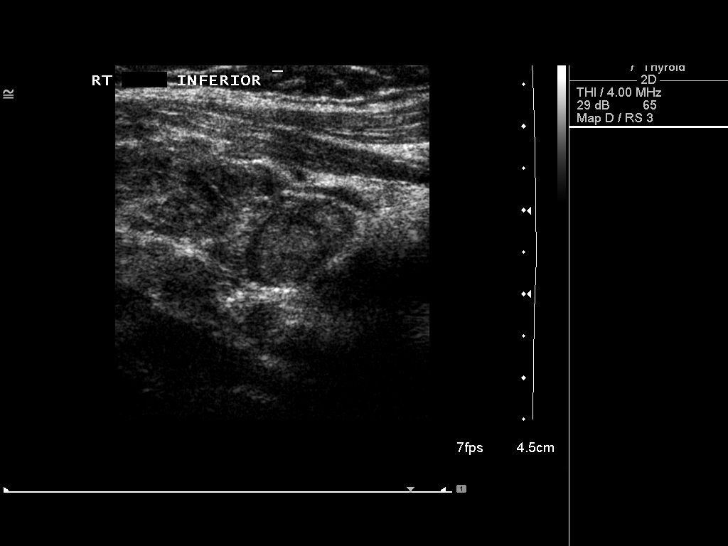
[im 29/63]
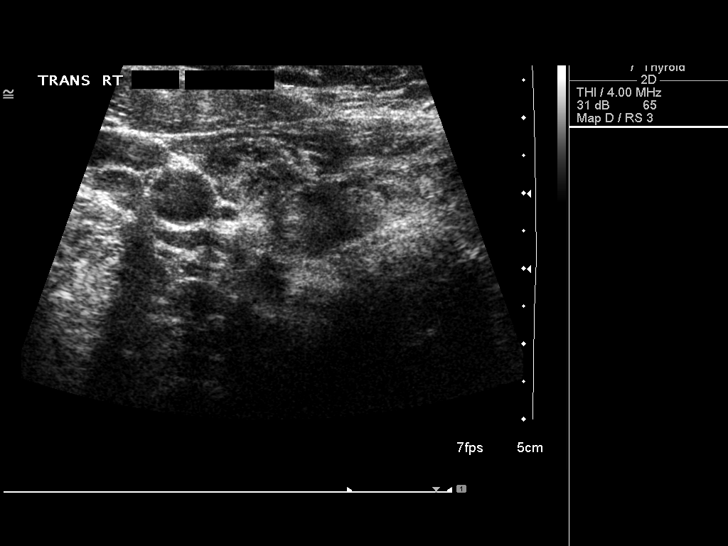
[im 34/63]
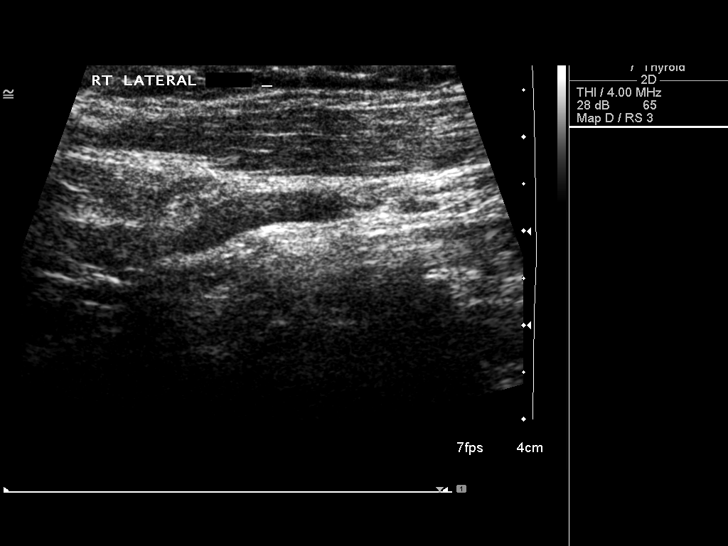
[im 39/63]
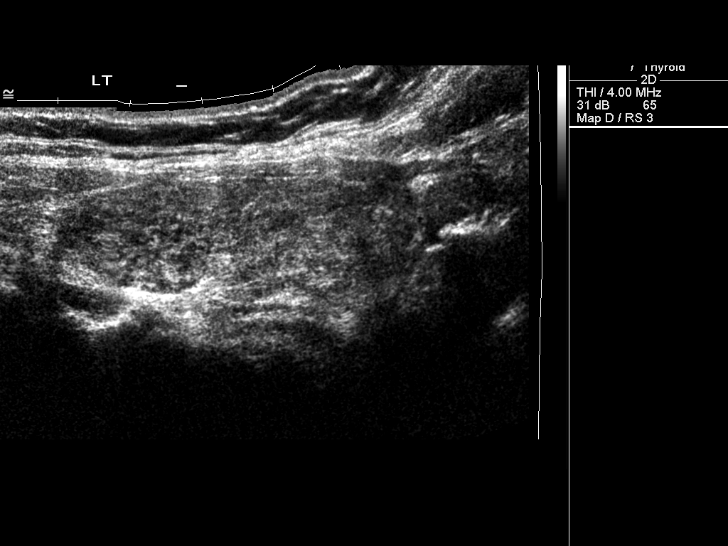
[im 42/63]
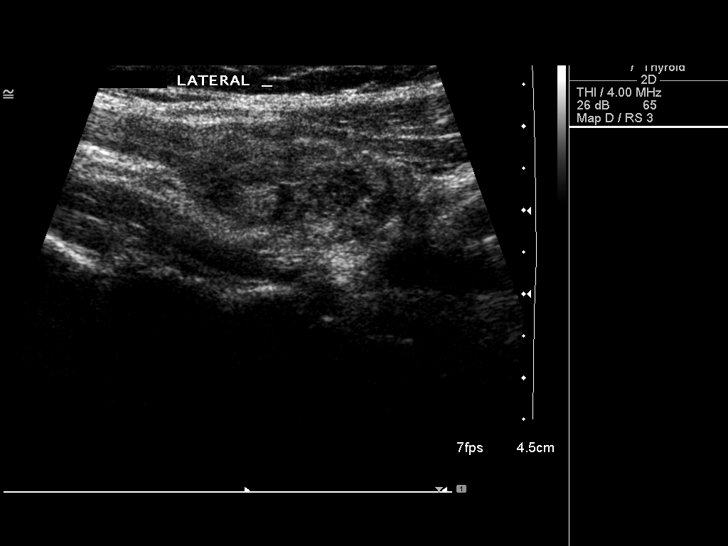
[im 47/63]
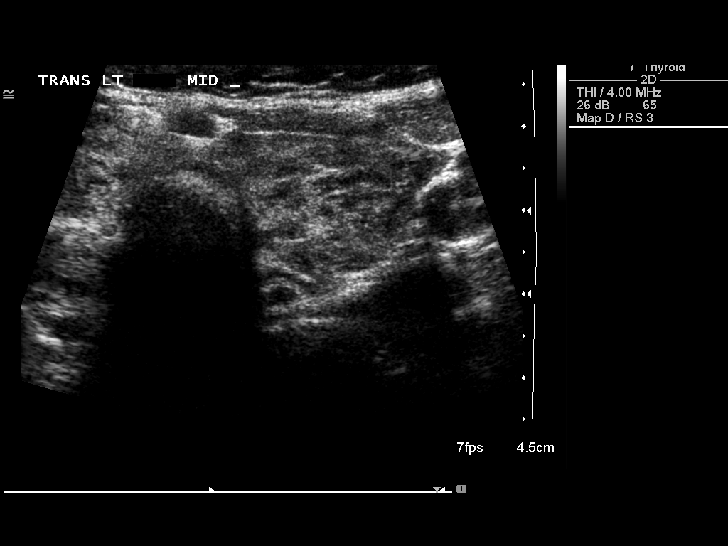
[im 52/63]
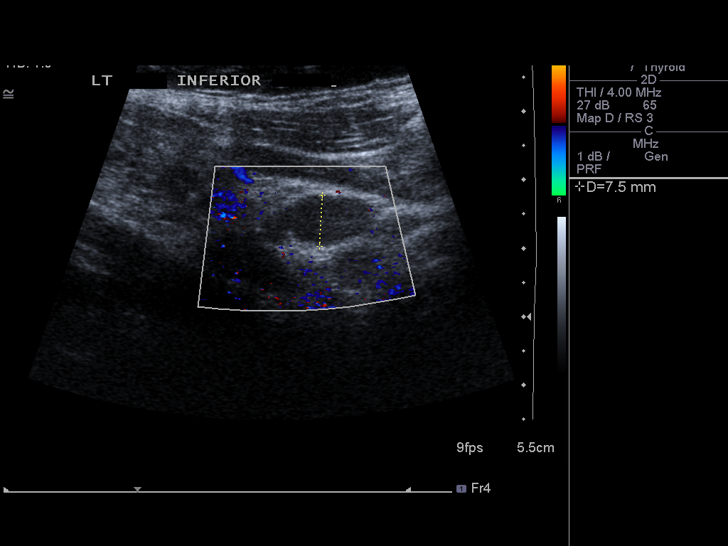
[im 57/63]
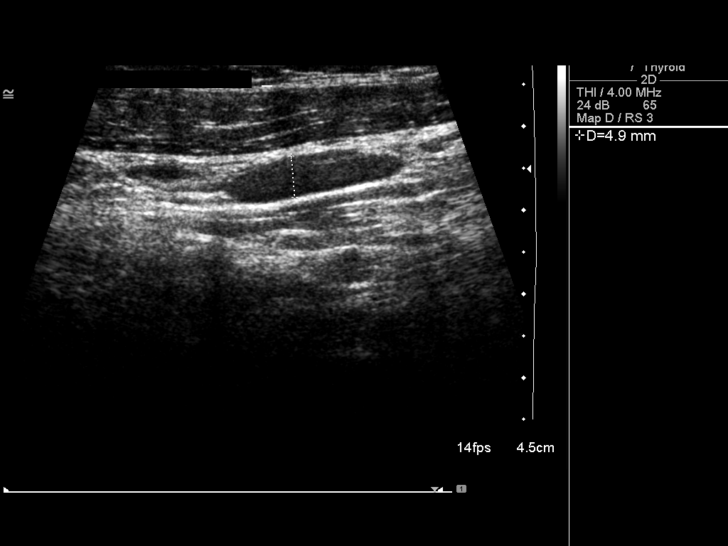
[im 63/63]
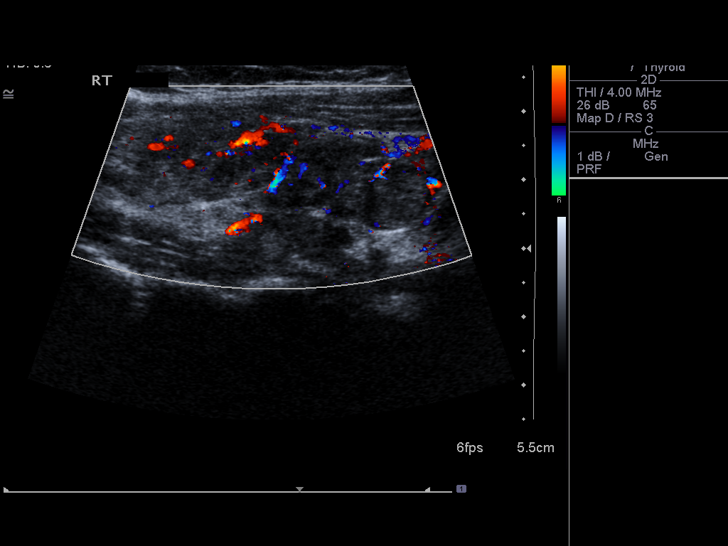

[14 of 25 positions shown; findings below may reference images not displayed]

FINDINGS: Right thyroid lobe

Measurements: 5.9 x 2.6 x 2.5 cm. 9 mm nodule noted in the inferior
aspect of the right lobe.

Left thyroid lobe

Measurements: 5.3 x 1.8 x 2.1 cm.  No nodules visualized.

Isthmus

Thickness: 0.9 cm.  No nodules visualized.

Lymphadenopathy

Small scattered bilateral neck nodes.
IMPRESSION: Persistent multinodular thyroid goiter without discrete dominant
nodule.

## 2016-06-18 ENCOUNTER — Other Ambulatory Visit: Payer: Self-pay | Admitting: Obstetrics and Gynecology

## 2016-06-18 DIAGNOSIS — R928 Other abnormal and inconclusive findings on diagnostic imaging of breast: Secondary | ICD-10-CM

## 2016-06-20 ENCOUNTER — Other Ambulatory Visit: Payer: Self-pay | Admitting: Obstetrics and Gynecology

## 2016-06-20 ENCOUNTER — Ambulatory Visit
Admission: RE | Admit: 2016-06-20 | Discharge: 2016-06-20 | Disposition: A | Discharge status: Home / Self Care | Payer: 59 | Source: Ambulatory Visit | Attending: Obstetrics and Gynecology | Admitting: Obstetrics and Gynecology

## 2016-06-20 DIAGNOSIS — N6320 Unspecified lump in the left breast, unspecified quadrant: Secondary | ICD-10-CM

## 2016-06-20 DIAGNOSIS — R928 Other abnormal and inconclusive findings on diagnostic imaging of breast: Secondary | ICD-10-CM

## 2017-05-10 ENCOUNTER — Other Ambulatory Visit: Payer: Self-pay | Admitting: Obstetrics and Gynecology

## 2017-05-10 DIAGNOSIS — Z1231 Encounter for screening mammogram for malignant neoplasm of breast: Secondary | ICD-10-CM

## 2017-06-20 ENCOUNTER — Ambulatory Visit
Admission: RE | Admit: 2017-06-20 | Discharge: 2017-06-20 | Disposition: A | Discharge status: Home / Self Care | Payer: 59 | Source: Ambulatory Visit | Attending: Obstetrics and Gynecology | Admitting: Obstetrics and Gynecology

## 2017-06-20 DIAGNOSIS — Z1231 Encounter for screening mammogram for malignant neoplasm of breast: Secondary | ICD-10-CM
